# Patient Record
Sex: Female | Born: 1965 | Marital: Married | Smoking: Never smoker
Health system: Southern US, Community
[De-identification: ages and names within clinical notes are randomized; demographics above are authoritative.]

## PROBLEM LIST (undated history)

## (undated) DIAGNOSIS — T7840XA Allergy, unspecified, initial encounter: Secondary | ICD-10-CM

## (undated) DIAGNOSIS — R634 Abnormal weight loss: Secondary | ICD-10-CM

## (undated) DIAGNOSIS — E119 Type 2 diabetes mellitus without complications: Secondary | ICD-10-CM

## (undated) DIAGNOSIS — E079 Disorder of thyroid, unspecified: Secondary | ICD-10-CM

## (undated) HISTORY — DX: Abnormal weight loss: R63.4

## (undated) HISTORY — DX: Type 2 diabetes mellitus without complications: E11.9

## (undated) HISTORY — DX: Allergy, unspecified, initial encounter: T78.40XA

## (undated) HISTORY — DX: Disorder of thyroid, unspecified: E07.9

---

## 2011-10-17 DIAGNOSIS — H269 Unspecified cataract: Secondary | ICD-10-CM | POA: Insufficient documentation

## 2011-10-17 DIAGNOSIS — H40009 Preglaucoma, unspecified, unspecified eye: Secondary | ICD-10-CM | POA: Insufficient documentation

## 2011-10-17 DIAGNOSIS — D1809 Hemangioma of other sites: Secondary | ICD-10-CM | POA: Insufficient documentation

## 2011-10-17 DIAGNOSIS — H3320 Serous retinal detachment, unspecified eye: Secondary | ICD-10-CM | POA: Insufficient documentation

## 2011-10-17 DIAGNOSIS — Z8669 Personal history of other diseases of the nervous system and sense organs: Secondary | ICD-10-CM | POA: Insufficient documentation

## 2012-01-02 DIAGNOSIS — H40003 Preglaucoma, unspecified, bilateral: Secondary | ICD-10-CM | POA: Insufficient documentation

## 2016-03-28 DIAGNOSIS — R7302 Impaired glucose tolerance (oral): Secondary | ICD-10-CM | POA: Insufficient documentation

## 2016-06-05 ENCOUNTER — Ambulatory Visit: Payer: Self-pay | Admitting: Podiatry

## 2016-06-26 ENCOUNTER — Ambulatory Visit (INDEPENDENT_AMBULATORY_CARE_PROVIDER_SITE_OTHER): Payer: Managed Care, Other (non HMO) | Admitting: Podiatry

## 2016-06-26 ENCOUNTER — Ambulatory Visit (HOSPITAL_BASED_OUTPATIENT_CLINIC_OR_DEPARTMENT_OTHER)
Admission: RE | Admit: 2016-06-26 | Discharge: 2016-06-26 | Disposition: A | Discharge status: Home / Self Care | Payer: Managed Care, Other (non HMO) | Source: Ambulatory Visit | Attending: Podiatry | Admitting: Podiatry

## 2016-06-26 ENCOUNTER — Encounter: Payer: Self-pay | Admitting: Podiatry

## 2016-06-26 DIAGNOSIS — M722 Plantar fascial fibromatosis: Secondary | ICD-10-CM | POA: Diagnosis not present

## 2016-06-26 MED ORDER — TRIAMCINOLONE ACETONIDE 10 MG/ML IJ SUSP
10.0000 mg | Freq: Once | INTRAMUSCULAR | Status: AC
  Administered 2016-06-26: 10 mg

## 2016-06-26 MED ORDER — IBUPROFEN 600 MG PO TABS: 600.0000 mg | ORAL_TABLET | Freq: Three times a day (TID) | ORAL | 1 refills | Status: DC | PRN

## 2016-06-26 NOTE — Patient Instructions (Signed)

## 2016-06-26 NOTE — Progress Notes (Signed)
   Subjective:    Patient ID: Kathleen Peterson, female    DOB: 12-15-1965, 51 y.o.   MRN: [id number]  HPI 51 year old female presents the office today for concerns of left heel pain which started in December 2017 with a for Christmas. She states that she been for a walk in a couple days later she started have heel pain. She tried taking meloxicam but this upset her stomach. She did purchase an over-the-counter insert which helps some her pain does continue. She is also been stretching as well as rolling her foot on a frozen water bottle. She denies any recent injury or trauma. No numbness or tingling. No pain at night. No other complaints at this time.   Review of Systems  All other systems reviewed and are negative.      Objective:   Physical Exam General: AAO x3, NAD  Dermatological: Skin is warm, dry and supple bilateral. Nails x 10 are well manicured; remaining integument appears unremarkable at this time. There are no open sores, no preulcerative lesions, no rash or signs of infection present.  Vascular: Dorsalis Pedis artery and Posterior Tibial artery pedal pulses are 2/4 bilateral with immedate capillary fill time. There is no pain with calf compression, swelling, warmth, erythema.   Neruologic: Grossly intact via light touch bilateral. Protective threshold with Semmes Wienstein monofilament intact to all pedal sites bilateral. Negative tinel sign.    Musculoskeletal: Tenderness to palpation along the plantar medial tubercle of the calcaneus at the insertion of plantar fascia on the left foot. There is no pain along the course of the plantar fascia within the arch of the foot. Plantar fascia appears to be intact. There is no pain with lateral compression of the calcaneus or pain with vibratory sensation. There is no pain along the course or insertion of the achilles tendon. No other areas of tenderness to bilateral lower extremities. Muscular strength 5/5 in all groups tested  bilateral.  Gait: Unassisted, Nonantalgic.     Assessment & Plan:  51 year old female left heel pain, likely plantar fasciitis. -Treatment options discussed including all alternatives, risks, and complications. -Etiology of symptoms were discussed -X-rays were obtained and reviewed with the patient.  -Patient elects to proceed with steroid injection into the left heel. Under sterile skin preparation, a total of 2.5cc of kenalog 10, 0.5% Marcaine plain, and 2% lidocaine plain were infiltrated into the symptomatic area without complication. A band-aid was applied. Patient tolerated the injection well without complication. Post-injection care with discussed with the patient. Discussed with the patient to ice the area over the next couple of days to help prevent a steroid flare.  -Plantar fascial brace dispensed -Prescribed ibuprofen. Discussed side effects of the medication and directed to stop if any are to occur and call the office.  -Stretching daily -Ice/heat contrast -Continue with the OTC inserts she has purchased and supportive shoes.   Celesta Gentile, DPM

## 2016-07-17 ENCOUNTER — Encounter: Payer: Self-pay | Admitting: Podiatry

## 2016-07-17 ENCOUNTER — Ambulatory Visit (INDEPENDENT_AMBULATORY_CARE_PROVIDER_SITE_OTHER): Payer: Managed Care, Other (non HMO) | Admitting: Podiatry

## 2016-07-17 DIAGNOSIS — M722 Plantar fascial fibromatosis: Secondary | ICD-10-CM

## 2016-07-18 NOTE — Progress Notes (Signed)
Subjective: 51 year old female presents the office today for follow-up evaluation of left heel pain, plantar fasciitis. She states that she is doing better but not the improvement she would like. She states that the pain in the more is not as excruciating is what was previously. She has been stretching, icing as well the injection did help. She denies any recent injury or trauma. No swelling or redness. The pain does not wake her up at night. Denies any systemic complaints such as fevers, chills, nausea, vomiting. No acute changes since last appointment, and no other complaints at this time.   Objective: AAO x3, NAD DP/PT pulses palpable bilaterally, CRT less than 3 seconds There is improved yet continued tenderness to palpation along the plantar medial tubercle of the calcaneus at the insertion of plantar fascia on the left foot. There is no pain along the course of the plantar fascia within the arch of the foot. Plantar fascia appears to be intact. There is no pain with lateral compression of the calcaneus or pain with vibratory sensation. There is no pain along the course or insertion of the achilles tendon. No other areas of tenderness to bilateral lower extremities. Equinus is present. No open lesions or pre-ulcerative lesions.  No pain with calf compression, swelling, warmth, erythema  Assessment: 52 year old female left heel pain, plantar fasciitis mild improvement  Plan: -All treatment options discussed with the patient including all alternatives, risks, complications.  -Patient elects to proceed with steroid injection into the left heel. Under sterile skin preparation, a total of 2.5cc of kenalog 10, 0.5% Marcaine plain, and 2% lidocaine plain were infiltrated into the symptomatic area without complication. A band-aid was applied. Patient tolerated the injection well without complication. Post-injection care with discussed with the patient. Discussed with the patient to ice the area over the  next couple of days to help prevent a steroid flare.  -Plantar fascial taking was applied. -Dispensed night splint. -Continue stretching, icing exercises daily. -Discussed more supportive shoe gear. Continue the over-the-counter inserts. Symptoms continue discussed custom inserts. -RTC in 3 weeks or sooner if needed.  -Patient encouraged to call the office with any questions, concerns, change in symptoms.   Celesta Gentile, DPM

## 2016-08-07 ENCOUNTER — Encounter: Payer: Self-pay | Admitting: Podiatry

## 2016-08-07 ENCOUNTER — Ambulatory Visit (INDEPENDENT_AMBULATORY_CARE_PROVIDER_SITE_OTHER): Payer: Managed Care, Other (non HMO) | Admitting: Podiatry

## 2016-08-07 DIAGNOSIS — M722 Plantar fascial fibromatosis: Secondary | ICD-10-CM | POA: Diagnosis not present

## 2016-08-08 ENCOUNTER — Telehealth: Payer: Self-pay | Admitting: *Deleted

## 2016-08-08 MED ORDER — METHYLPREDNISOLONE 4 MG PO TBPK: ORAL_TABLET | ORAL | 0 refills | Status: AC

## 2016-08-08 NOTE — Telephone Encounter (Signed)
Pt states she was seen yesterday and Dr. Jacqualyn Posey was to prescribe a steroid, but it was not at the pharmacy. I informed Dr.Wagoner and he ordered. Left message informing pt the steroid pack was at [id number].

## 2016-08-08 NOTE — Progress Notes (Signed)
Subjective: Kathleen Peterson presents to the office today for follow-up evaluation of left heel pain. They state that they are doing somewhat better. She states it is not as bad as what it was when we started but she still gets pain. They have been icing, stretching, try to wear supportive shoe as much as possible. No other complaints at this time. No acute changes since last appointment. They deny any systemic complaints such as fevers, chills, nausea, vomiting.  Objective: General: AAO x3, NAD  Dermatological: Skin is warm, dry and supple bilateral. Nails x 10 are well manicured; remaining integument appears unremarkable at this time. There are no open sores, no preulcerative lesions, no rash or signs of infection present.  Vascular: Dorsalis Pedis artery and Posterior Tibial artery pedal pulses are 2/4 bilateral with immedate capillary fill time. Pedal hair growth present. There is no pain with calf compression, swelling, warmth, erythema.   Neruologic: Grossly intact via light touch bilateral. Vibratory intact via tuning fork bilateral. Protective threshold with Semmes Wienstein monofilament intact to all pedal sites bilateral. Negative tinel sign  Musculoskeletal: There is improved but continues tenderness palpation along the plantar medial tubercle of the calcaneus at the insertion of the plantar fascia on the left foot. There is no pain along the course of the plantar fascia within the arch of the foot. Plantar fascia appears to be intact bilaterally. There is no pain with lateral compression of the calcaneus and there is no pain with vibratory sensation. There is no pain along the course or insertion of the Achilles tendon. There are no other areas of tenderness to bilateral lower extremities. No gross boney pedal deformities bilateral. No pain, crepitus, or limitation noted with foot and ankle range of motion bilateral. Muscular strength 5/5 in all groups tested bilateral.  Gait: Unassisted,  Nonantalgic.   Assessment: Presents for follow-up evaluation for heel pain, likely plantar fasciitis   Plan: -Treatment options discussed including all alternatives, risks, and complications -Patient elects to proceed with steroid injection into the left heel. Under sterile skin preparation, a total of 2.5cc of kenalog 10, 0.5% Marcaine plain, and 2% lidocaine plain were infiltrated into the symptomatic area without complication. A band-aid was applied. Patient tolerated the injection well without complication. Post-injection care with discussed with the patient. Discussed with the patient to ice the area over the next couple of days to help prevent a steroid flare.  -Medrol dose pack. Once complete can restart mobic -Plantar fascial taping applied.  -Ice and stretching exercises on a daily basis. -Continue supportive shoe gear. -Follow-up in 3 weeks or sooner if any problems arise. In the meantime, encouraged to call the office with any questions, concerns, change in symptoms.   Celesta Gentile, DPM

## 2016-09-11 ENCOUNTER — Encounter: Payer: Self-pay | Admitting: Podiatry

## 2016-09-11 ENCOUNTER — Ambulatory Visit (INDEPENDENT_AMBULATORY_CARE_PROVIDER_SITE_OTHER): Payer: Managed Care, Other (non HMO) | Admitting: Podiatry

## 2016-09-11 DIAGNOSIS — M722 Plantar fascial fibromatosis: Secondary | ICD-10-CM

## 2016-09-11 NOTE — Progress Notes (Signed)
Subjective: Kathleen Peterson presents to the office today for follow-up evaluation of left heel pain. She states that overall she is doing better but she still gets pain swelling of her left heel. She was recently in [place] and she had ocular puncture performed for 2 treatments and she felt this actually helped some. She states the pain is intermittent in nature and can come and go. She states some days in the morning or her symptoms will be fine. She does notice that the changes of activity and she's doing frustrated as she just wants to go for a long walk without any pain. She denies any numbness or tingling, grip and it. She has no recent injury. No other complaints no new concerns today.   Objective: General: AAO x3, NAD  Dermatological:  There are no open sores, no preulcerative lesions, no rash or signs of infection present.  Vascular: Dorsalis Pedis artery and Posterior Tibial artery pedal pulses are 2/4 bilateral with immedate capillary fill time. Pedal hair growth present. There is no pain with calf compression, swelling, warmth, erythema.   Neruologic: Grossly intact via light touch bilateral.   Musculoskeletal: There is mild continued tenderness palpation along the plantar medial tubercle of the calcaneus at the insertion of the plantar fascia on the left foot. There is no pain along the course of the plantar fascia within the arch of the foot. Plantar fascia appears to be intact bilaterally. There is no pain with lateral compression of the calcaneus and there is no pain with vibratory sensation. There is no pain along the course or insertion of the Achilles tendon. There are no other areas of tenderness to bilateral lower extremities. No gross boney pedal deformities bilateral. No pain, crepitus, or limitation noted with foot and ankle range of motion bilateral. Muscular strength 5/5 in all groups tested bilateral.  Gait: Unassisted, Nonantalgic.   Assessment: Presents for follow-up  evaluation for heel pain, likely plantar fasciitis   Plan: -Treatment options discussed including all alternatives, risks, and complications -At this point all of her symptoms are improving there still persistent. I've recommended physical therapy prescription was debrided today for [facility]. -She is inquiring about a compression sleeve/arch sleeve for plantar fasciitis. We'll try to get her support and call her once we get this. -Continue stretching, icing exercises at home as well as supportive shoe gear and orthotics. I recommend her to wear tennis shoe types shoe while this is symptomatic. -Follow-up in 4-6 weeks or sooner if any problems arise. In the meantime, encouraged to call the office with any questions, concerns, change in symptoms.   Celesta Gentile, DPM

## 2016-10-30 ENCOUNTER — Ambulatory Visit (INDEPENDENT_AMBULATORY_CARE_PROVIDER_SITE_OTHER): Payer: Managed Care, Other (non HMO) | Admitting: Podiatry

## 2016-10-30 ENCOUNTER — Encounter: Payer: Self-pay | Admitting: Podiatry

## 2016-10-30 ENCOUNTER — Telehealth: Payer: Self-pay | Admitting: *Deleted

## 2016-10-30 DIAGNOSIS — G8929 Other chronic pain: Secondary | ICD-10-CM

## 2016-10-30 DIAGNOSIS — M722 Plantar fascial fibromatosis: Secondary | ICD-10-CM

## 2016-10-30 DIAGNOSIS — M79673 Pain in unspecified foot: Secondary | ICD-10-CM | POA: Diagnosis not present

## 2016-10-30 DIAGNOSIS — M79672 Pain in left foot: Principal | ICD-10-CM

## 2016-10-30 NOTE — Telephone Encounter (Addendum)
-----   Message from Trula Slade, DPM sent at 10/30/2016 11:45 AM EDT ----- Can you please order an MRI of the left foot to rule ou plantar fascial tear? She would like to get this done ASAP. She is OK doing this at the [facility] or [place] or wherever she can get this done ASAP. Thanks. Orders given to [facility] for pre-cert and faxed to [email or web address]

## 2016-10-30 NOTE — Progress Notes (Signed)
Subjective: Kathleen Peterson presents to the office today for follow-up evaluation of left heel pain. She presents to the upset and frustrated she still having pain to her heel. She's been continued physical therapy she states that she is actually doing well with her therapist. However her physical therapist is out of town any therapist was taking over and she did her exercises barefoot and since then she has had ongoing pain to the area. She states that the pain is consistent with a day and she is scared even walk at times because she may fall because of the pain. She denies any numbness or tingling in the pain does not wake her up at night. She has continue stretching, icing, try to wear supportive shoes any significant improvement. No other complaints no new concerns today.   Objective: General: AAO x3, NAD  Dermatological:  There are no open sores, no preulcerative lesions, no rash or signs of infection present.  Vascular: Dorsalis Pedis artery and Posterior Tibial artery pedal pulses are 2/4 bilateral with immedate capillary fill time. Pedal hair growth present. There is no pain with calf compression, swelling, warmth, erythema.   Neruologic: Grossly intact via light touch bilateral.   Musculoskeletal: There is continued tenderness palpation along the plantar medial tubercle of the calcaneus at the insertion of the plantar fascia on the left foot. There is no pain along the course of the plantar fascia within the arch of the foot. Plantar fascia appears to be intact bilaterally. There is no pain with lateral compression of the calcaneus and there is no pain with vibratory sensation. There is no pain along the course or insertion of the Achilles tendon. There are no other areas of tenderness to bilateral lower extremities. No gross boney pedal deformities bilateral. No pain, crepitus, or limitation noted with foot and ankle range of motion bilateral. Muscular strength 5/5 in all groups tested bilateral.  Equinus is present. Overall, the exam is unchanged.   Gait: Unassisted, Nonantalgic.   Assessment: Presents for follow-up evaluation for heel pain, likely plantar fasciitis   Plan: -Treatment options discussed including all alternatives, risks, and complications -At this point given her ongoing symptoms I recommended an MRI. She is attended numerous conservative treatments without any significant improvement. This is also for possible surgical planning. -Given her longevity symptoms will immobilize and a CAM boot which was dispensed today. -I ordered an arthritis panel today. -Long-term discussed there custom orthotics, possible surgery, possible EPAT. However, will await the MRI before proceeding. She agrees to this plan and wants an MRI. Will try to get this done ASAP.   Celesta Gentile, DPM

## 2016-11-04 ENCOUNTER — Ambulatory Visit
Admission: RE | Admit: 2016-11-04 | Discharge: 2016-11-04 | Disposition: A | Discharge status: Home / Self Care | Payer: Managed Care, Other (non HMO) | Source: Ambulatory Visit | Attending: Podiatry | Admitting: Podiatry

## 2016-11-05 ENCOUNTER — Telehealth: Payer: Self-pay | Admitting: Podiatry

## 2016-11-05 NOTE — Telephone Encounter (Signed)
I informed pt I would tell Dr. Jacqualyn Posey the results for both labs and MRI were available. I told pt she could stop by the [facility] and L. Cox would exchange the boot. Pt states understanding.

## 2016-11-05 NOTE — Telephone Encounter (Signed)
I'm calling about blood work I had done last week and the MRI I had done yesterday. I was calling to see if you have any results in yet. Also, the brace Dr. Jacqualyn Posey gave me, the medal piece is coming off and its not holding together well. If you would, please call me back.

## 2016-11-05 NOTE — Telephone Encounter (Signed)
Called patient and will start EPAT.

## 2016-11-06 LAB — HLA-B27 ANTIGEN: HLA B27: NEGATIVE

## 2016-11-06 LAB — C-REACTIVE PROTEIN: CRP: 3 mg/L (ref 0.0–4.9)

## 2016-11-06 LAB — RHEUMATOID FACTOR

## 2016-11-06 LAB — SEDIMENTATION RATE: Sed Rate: 2 mm/hr (ref 0–40)

## 2016-11-06 LAB — ANA: ANA: NEGATIVE

## 2016-11-08 ENCOUNTER — Ambulatory Visit (INDEPENDENT_AMBULATORY_CARE_PROVIDER_SITE_OTHER): Payer: Managed Care, Other (non HMO) | Admitting: Podiatry

## 2016-11-08 DIAGNOSIS — M722 Plantar fascial fibromatosis: Secondary | ICD-10-CM

## 2016-11-09 NOTE — Progress Notes (Signed)
Pt presents with ongoing pain in her left heel. She has tried various treatments with no long term success.   Pain on palpation of Lt heel medial plantar fascial band, with swelling  ESWT 1.8 joules delivered to Lt heel and tolerated well. EPAT delivered to surrounding connective tissues. Advised on boot usage and avoidance of NSAIDs and ice. Follow up in 1 week for 2nd treatment

## 2016-11-14 ENCOUNTER — Encounter: Payer: Self-pay | Admitting: Podiatry

## 2016-11-14 ENCOUNTER — Ambulatory Visit (INDEPENDENT_AMBULATORY_CARE_PROVIDER_SITE_OTHER): Payer: Self-pay

## 2016-11-14 DIAGNOSIS — M722 Plantar fascial fibromatosis: Secondary | ICD-10-CM

## 2016-11-14 NOTE — Progress Notes (Signed)
MRI did not show the insertion and given continued pain discussed with Kathleen Peterson about getting a revision of the MRI. We had ordered this as the foot to look for the plantar fascia but did not include the heel. Kathleen Peterson is going to contact [facility] to try to have them do a revision of the MRI to look at the entire fascia.   Kathleen Peterson has had numerous conservative treatments without complete resolution of pain. She was actually doing better at physical therapy but she states that her normal therapist was out of town and she had a replacement. The new therapist did the exercises barefoot. Once she did this it seems that she took a big step back. She did relate that she had some improvement in the CAM boot but has not resolved the pain completely. For now will continue with EPAT for another treatment. After this would get a repeat MRI.    Kathleen Peterson, DPM

## 2016-11-14 NOTE — Progress Notes (Signed)
Pt presents with ongoing pain in her left heel. She has tried various treatments with no long term success. She said she has noticed a small improvement but her pain is still present  Pain on palpation of Lt heel medial plantar fascial band, with swelling  ESWT 2.2 joules delivered to Lt heel and tolerated well. EPAT delivered to surrounding connective tissues. Advised on boot usage and avoidance of NSAIDs and ice. Follow up in 1 week for 3rd treatment

## 2016-11-22 ENCOUNTER — Ambulatory Visit (INDEPENDENT_AMBULATORY_CARE_PROVIDER_SITE_OTHER): Payer: Self-pay | Admitting: Podiatry

## 2016-11-22 DIAGNOSIS — M722 Plantar fascial fibromatosis: Secondary | ICD-10-CM

## 2016-11-22 NOTE — Progress Notes (Signed)
Pt presents with ongoing pain in her left heel. She has tried various treatments with no long term success. She said she has noticed an improvement her pain is still present but improved  Pain on palpation of Lt heel medial plantar fascial band, with swelling  ESWT 2.2 joules delivered to Lt heel and tolerated well. EPAT delivered to surrounding connective tissues. Dispensed powersteps (no charge), and advised on caution when increasing activities. We will consider ordering a revised MRI after completion of ESWT therapy. Advised on boot usage and avoidance of NSAIDs and ice. Follow up in 4 weeks for 4th treatment

## 2016-12-21 ENCOUNTER — Ambulatory Visit: Payer: Managed Care, Other (non HMO)

## 2017-01-01 ENCOUNTER — Ambulatory Visit (INDEPENDENT_AMBULATORY_CARE_PROVIDER_SITE_OTHER): Payer: Managed Care, Other (non HMO) | Admitting: Podiatry

## 2017-01-01 DIAGNOSIS — M722 Plantar fascial fibromatosis: Secondary | ICD-10-CM

## 2017-01-02 DIAGNOSIS — Z86018 Personal history of other benign neoplasm: Secondary | ICD-10-CM | POA: Insufficient documentation

## 2017-01-02 DIAGNOSIS — Z9889 Other specified postprocedural states: Secondary | ICD-10-CM | POA: Insufficient documentation

## 2017-01-02 DIAGNOSIS — E896 Postprocedural adrenocortical (-medullary) hypofunction: Secondary | ICD-10-CM | POA: Insufficient documentation

## 2017-01-02 DIAGNOSIS — E039 Hypothyroidism, unspecified: Secondary | ICD-10-CM | POA: Insufficient documentation

## 2017-01-02 NOTE — Progress Notes (Signed)
Pt presents with ongoing pain in her left heel. She has tried various treatments with no long term success. She said she has noticed an improvement her pain is still present but improved a lot. Recently she has been on 2 trips that require a lot of walking and her pain has been manageable  Pain on palpation of Lt heel medial plantar fascial band, with swelling  ESWT 1.5 joules delivered to Lt heel and tolerated well. EPAT delivered to surrounding connective tissues. Re-appointed in 4 weeks to see Dr Jacqualyn Posey

## 2017-02-12 ENCOUNTER — Ambulatory Visit (INDEPENDENT_AMBULATORY_CARE_PROVIDER_SITE_OTHER): Payer: Managed Care, Other (non HMO) | Admitting: Podiatry

## 2017-02-12 ENCOUNTER — Encounter: Payer: Self-pay | Admitting: Podiatry

## 2017-02-12 DIAGNOSIS — M722 Plantar fascial fibromatosis: Secondary | ICD-10-CM

## 2017-02-13 NOTE — Progress Notes (Signed)
Subjective: Kathleen Peterson presents the office today for follow-up evaluation of left heel pain. She states that she is doing much better and she has been walking the dog the last couple weeks which is been very good for her she states. She is not able to this and for some time. She states that the EPAT has helped quite a bit. She still gets pain on daily basis but to a much lower degree than what she was having. She had 4 treatments of the EPAT. She has been continuing stretching but she has not been icing. She wears the orthotics all the time. She has no other concerns today.  Denies any systemic complaints such as fevers, chills, nausea, vomiting. No acute changes since last appointment, and no other complaints at this time.   Objective: AAO x3, NAD DP/PT pulses palpable bilaterally, CRT less than 3 seconds There is mild but continued tenderness to palpation along the plantar medial tubercle of the calcaneus at the insertion of plantar fascia on the let foot. Overall with tenderness is much improved There is no pain along the course of the plantar fascia within the arch of the foot. Plantar fascia appears to be intact. There is no pain with lateral compression of the calcaneus or pain with vibratory sensation. There is no pain along the course or insertion of the achilles tendon. No other areas of tenderness to bilateral lower extremities. No open lesions or pre-ulcerative lesions.  No pain with calf compression, swelling, warmth, erythema  Assessment: Left heel pain and plantar fasciitis which is improving   Plan: -All treatment options discussed with the patient including all alternatives, risks, complications.  -At this time a discussed further treatment options both conservative and surgical. We will do one more treatment of the EPAT and we will get the [place] offices schedule her for this. Continue with stretching and she denies in the meantime until her last treatment. She agrees this plan. We'll  hold off on surgical intervention this time. -Patient encouraged to call the office with any questions, concerns, change in symptoms.   Celesta Gentile, DPM

## 2017-02-22 ENCOUNTER — Ambulatory Visit: Payer: Managed Care, Other (non HMO) | Admitting: Podiatry

## 2017-02-22 DIAGNOSIS — M722 Plantar fascial fibromatosis: Secondary | ICD-10-CM

## 2017-02-25 NOTE — Progress Notes (Signed)
Pt presents with ongoing pain in her left heel. She has tried various treatments with no long term success.She states that she really has not noticed an improvement in her pain since starting treatments  Pain on palpation of Lt heel medial plantar fascial band, with swelling  ESWT 1.5 joules delivered to Lt heel and tolerated well. EPAT delivered to surrounding connective tissues. Re-appointed in 2-3 weeks to see Dr Jacqualyn Posey if there has been no improvement in her pain. Patient states that if she needs surgery then she wants to get it done before the end of the year

## 2017-03-12 ENCOUNTER — Ambulatory Visit: Payer: Managed Care, Other (non HMO) | Admitting: Podiatry

## 2017-03-12 ENCOUNTER — Encounter: Payer: Self-pay | Admitting: Podiatry

## 2017-03-12 DIAGNOSIS — M722 Plantar fascial fibromatosis: Secondary | ICD-10-CM | POA: Diagnosis not present

## 2017-03-12 NOTE — Patient Instructions (Signed)
[facility] [place], [place] [phone]  [address]   The procedure we talked about was called "Topaz"

## 2017-03-14 ENCOUNTER — Telehealth: Payer: Self-pay | Admitting: Podiatry

## 2017-03-14 NOTE — Telephone Encounter (Signed)
Routed message to Marcine Matar, she has been discussing the situation with [facility].

## 2017-03-14 NOTE — Progress Notes (Signed)
Subjective: Kathleen Peterson presents the office today for follow-up evaluation of left heel pain.  She does state that overall her pain has improved compared to when I first met her but she still gets pain on a daily basis she does have good days and bad days.  She states that she will have a good day and all of a sudden she will get a sharp pain or shoulder throbbing pain.  She still gets pain in the mornings when she first gets up or if she is been sitting down for some time and stands back up.  She denies any recent injury or trauma denies any increase in swelling or redness.  The pain does not wake her up at night. The EPAT was helping at first but no longer has been helpful.  Denies any systemic complaints such as fevers, chills, nausea, vomiting. No acute changes since last appointment, and no other complaints at this time.   Objective: AAO x3, NAD DP/PT pulses palpable bilaterally, CRT less than 3 seconds There is mild but continued tenderness to palpation along the plantar medial tubercle of the calcaneus at the insertion of plantar fascia on the left foot. There is no pain along the course of the plantar fascia within the arch of the foot. Plantar fascia appears to be intact. There is no pain with lateral compression of the calcaneus or pain with vibratory sensation. There is no pain along the course or insertion of the achilles tendon. No other areas of tenderness to bilateral lower extremities.  NO other areas of pinpoint tenderness.  Negative tinel sign No open lesions or pre-ulcerative lesions.  No pain with calf compression, swelling, warmth, erythema  Assessment: Chronic left heel pain; plantar fasciitis  Plan: -All treatment options discussed with the patient including all alternatives, risks, complications.  -At this point she is attempted numerous conservative treatments without any significant complete resolution of pain.  Had a discussion today in regards to further treatment options.  I  able to get the MRI completed to evaluate the entire length of the plantar fascia as and she had the MRI done only a portion of it was completed.  We will contact [facility] in regards to getting this done. -We discussed other treatment options at this point including conservative as well as surgical options.  She states that she does not want to proceed with an EPF if able to avoid it.  Discussed other treatment options.  We discussed medical massage and reflexology. We also discussed other surgical options including Topaz. We discussed amniotic injection, PRP as well.  She will start with trying more conservative treatment before surgery and we will await the new MRI.  -Patient encouraged to call the office with any questions, concerns, change in symptoms.   Trula Slade DPM

## 2017-03-14 NOTE — Telephone Encounter (Signed)
I saw Dr. Jacqualyn Posey on Tuesday and he was going to have my MRI repeated. I have not heard anything from anyone so I was following up on this. My number is [phone].

## 2017-03-19 ENCOUNTER — Telehealth: Payer: Self-pay

## 2017-03-19 NOTE — Telephone Encounter (Signed)
Spoke with patient informed her that I had been in touch with [facility] and that we were still awaiting a response from Dr Posey Pronto about whether to have patient come in for an addendum to the original order or if the patient will need an entire separate MRI for the ankle.

## 2017-03-21 ENCOUNTER — Other Ambulatory Visit: Payer: Self-pay | Admitting: Podiatry

## 2017-03-21 DIAGNOSIS — M79672 Pain in left foot: Secondary | ICD-10-CM

## 2017-03-22 ENCOUNTER — Telehealth: Payer: Self-pay

## 2017-03-22 ENCOUNTER — Ambulatory Visit
Admission: RE | Admit: 2017-03-22 | Discharge: 2017-03-22 | Disposition: A | Discharge status: Home / Self Care | Payer: Managed Care, Other (non HMO) | Source: Ambulatory Visit | Attending: Podiatry | Admitting: Podiatry

## 2017-03-22 DIAGNOSIS — M79672 Pain in left foot: Secondary | ICD-10-CM

## 2017-03-22 NOTE — Telephone Encounter (Signed)
Spoke with patient explaining to her about her MRI results. Informed her to continue with massage therapy and once its complete if she was still in pain then she needed to make an appt to be seen

## 2017-05-06 ENCOUNTER — Telehealth: Payer: Self-pay

## 2017-05-06 NOTE — Telephone Encounter (Signed)
Spoke with patient inquiring about her progression with Neuromuscular therapy. She states that she continues to work with the therapist and has had 2 days without pain in her feet. I advised her should anything change she is to make an appt to see Dr Jacqualyn Posey for evaluation

## 2017-08-09 ENCOUNTER — Ambulatory Visit: Payer: Managed Care, Other (non HMO) | Admitting: Podiatry

## 2017-08-09 ENCOUNTER — Ambulatory Visit (INDEPENDENT_AMBULATORY_CARE_PROVIDER_SITE_OTHER): Payer: Managed Care, Other (non HMO)

## 2017-08-09 ENCOUNTER — Encounter: Payer: Self-pay | Admitting: Podiatry

## 2017-08-09 DIAGNOSIS — M779 Enthesopathy, unspecified: Secondary | ICD-10-CM | POA: Diagnosis not present

## 2017-08-09 DIAGNOSIS — M79674 Pain in right toe(s): Secondary | ICD-10-CM

## 2017-08-09 DIAGNOSIS — M2041 Other hammer toe(s) (acquired), right foot: Secondary | ICD-10-CM

## 2017-08-12 ENCOUNTER — Telehealth: Payer: Self-pay | Admitting: *Deleted

## 2017-08-12 ENCOUNTER — Ambulatory Visit (INDEPENDENT_AMBULATORY_CARE_PROVIDER_SITE_OTHER): Payer: Managed Care, Other (non HMO) | Admitting: Orthotics

## 2017-08-12 DIAGNOSIS — M79672 Pain in left foot: Secondary | ICD-10-CM

## 2017-08-12 DIAGNOSIS — M722 Plantar fascial fibromatosis: Secondary | ICD-10-CM

## 2017-08-12 DIAGNOSIS — G8929 Other chronic pain: Secondary | ICD-10-CM

## 2017-08-12 DIAGNOSIS — M779 Enthesopathy, unspecified: Secondary | ICD-10-CM

## 2017-08-12 DIAGNOSIS — M2041 Other hammer toe(s) (acquired), right foot: Secondary | ICD-10-CM

## 2017-08-12 MED ORDER — MELOXICAM 15 MG PO TABS: 15.0000 mg | ORAL_TABLET | Freq: Every day | ORAL | 0 refills | Status: DC

## 2017-08-12 NOTE — Addendum Note (Signed)
Addended by: Celesta Gentile R on: 08/12/2017 09:08 AM   Modules accepted: Orders

## 2017-08-12 NOTE — Progress Notes (Signed)
Patient came into today to be cast for Custom Foot Orthotics. Upon recommendation of Dr. Jacqualyn Posey  Patient presents with hx of PF (L), and now has 2nd cap (L) Goals are offloading 2nd met with sulcus length dress orthotics Plan vendor [employer]

## 2017-08-12 NOTE — Telephone Encounter (Signed)
I informed pt the meloxicam had been called to the [facility].

## 2017-08-12 NOTE — Progress Notes (Signed)
Subjective: 52 year old female presents the office with her new concerns of pain to her right foot and she points on the second toe.  She states that the toe feels like it is jammed and she has noticed some swelling to the toe joint, pointing to the MPJ.  This is been ongoing for the last 2 weeks.  Denies any recent injury or trauma she said no recent treatment.  States that the left foot is doing much better.  She has some occasional discomfort but overall is much improved.  She is wearing insert only on her left foot. Denies any systemic complaints such as fevers, chills, nausea, vomiting. No acute changes since last appointment, and no other complaints at this time.   Objective: AAO x3, NAD DP/PT pulses palpable bilaterally, CRT less than 3 seconds On the right foot there is tenderness of the right second MTPJ there is localized edema to this area.  Also hammertoe contractures present.  There is no specific area pinpoint bony tenderness or pain to vibratory sensation.  There is no other areas of tenderness identified to the right side.  Very minimal discomfort to the left heel on the plantar medial tubercle of the calcaneus and insertion of plantar fascia. No open lesions or pre-ulcerative lesions.  No pain with calf compression, swelling, warmth, erythema  Assessment: Capsulitis right foot, hammertoe deformity  Plan: -All treatment options discussed with the patient including all alternatives, risks, complications.  -X-rays were obtained and reviewed.  No definitive evidence of acute fracture or stress fracture.  Elongated second metatarsal is also present. -Discussed a steroid injection and she wished to proceed.  See procedure note below. -Offloading pads were dispensed to help offload submetatarsal 2.  I discussed that she should not wear just one orthotic.  I would try to wear either mail orthotics or an insert in both shoes but this is off balance. -Discussed wearing a stiffer soled  shoe. -Patient encouraged to call the office with any questions, concerns, change in symptoms.   Procedure: Injection MPJ Discussed alternatives, risks, complications and verbal consent was obtained.  Location: Right second MTPJ-injection was directed into and around the MPJ Skin Prep: Betadine. Injectate: 0.5cc 0.5% marcaine plain, 0.5 cc 2% lidocaine plain and, 0.5 cc kenalog 10. Disposition: Patient tolerated procedure well. Injection site dressed with a band-aid.  Post-injection care was discussed and return precautions discussed.    Trula Slade DPM

## 2017-09-08 ENCOUNTER — Other Ambulatory Visit: Payer: Self-pay | Admitting: Podiatry

## 2017-09-12 ENCOUNTER — Ambulatory Visit: Payer: Managed Care, Other (non HMO) | Admitting: Orthotics

## 2017-09-12 DIAGNOSIS — M779 Enthesopathy, unspecified: Secondary | ICD-10-CM

## 2017-09-12 DIAGNOSIS — M722 Plantar fascial fibromatosis: Secondary | ICD-10-CM

## 2017-09-12 DIAGNOSIS — M2041 Other hammer toe(s) (acquired), right foot: Secondary | ICD-10-CM

## 2017-09-12 NOTE — Progress Notes (Signed)
Patient came in today to pick up custom made foot orthotics.  The goals were accomplished and the patient reported no dissatisfaction with said orthotics.  Patient was advised of breakin period and how to report any issues. 

## 2017-10-08 ENCOUNTER — Other Ambulatory Visit: Payer: Self-pay | Admitting: Podiatry

## 2018-01-16 IMAGING — MR MR FOOT*L* W/O CM
3 of 5 series · 8 of 40 positions shown · non-contrast
Comparison: MRI left forefoot dated November 04, 2016.

CLINICAL DATA: Left foot pain.  Concern for plantar fasciitis.

EXAM:
MRI OF THE LEFT FOOT WITHOUT CONTRAST
TECHNIQUE: Multiplanar, multisequence MR imaging of the left hindfoot was
performed. No intravenous contrast was administered.

[Series 3: PD fat-sat · axial · left · 4.0mm · 0.20mm/px · z∈[-32,+46]mm · 3 of 27 slices shown]
[im 5/27]
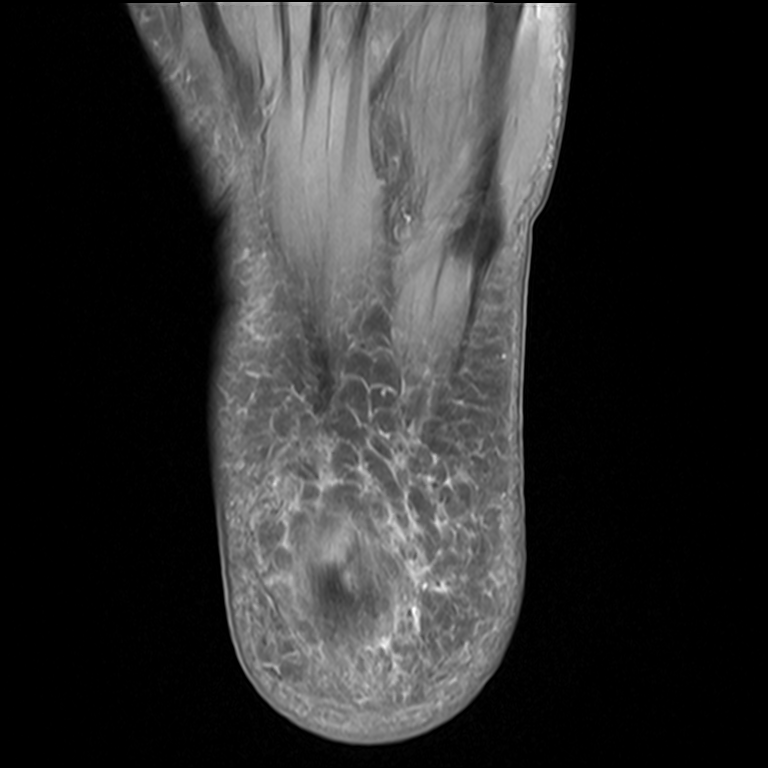
[im 14/27]
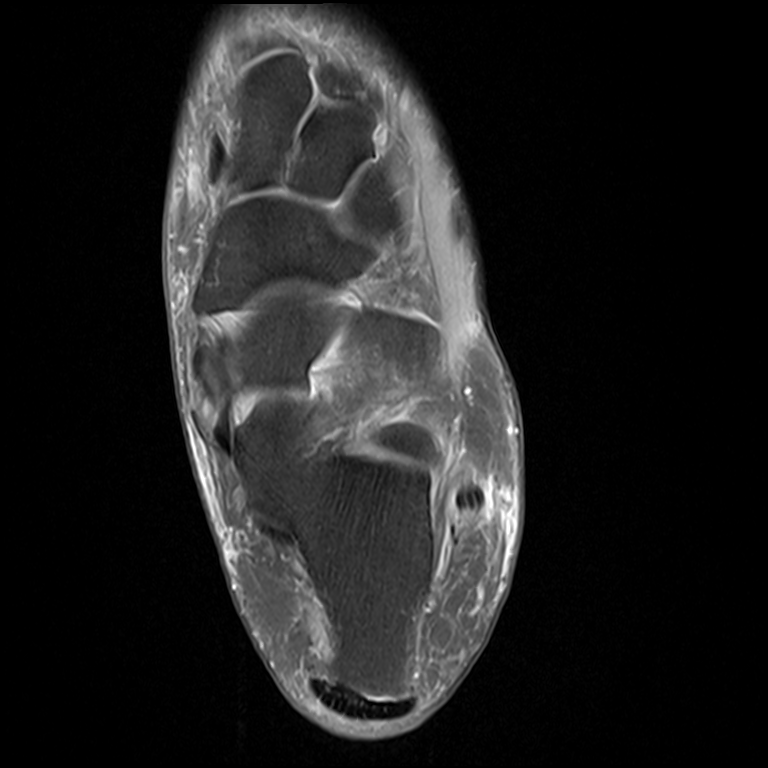
[im 22/27]
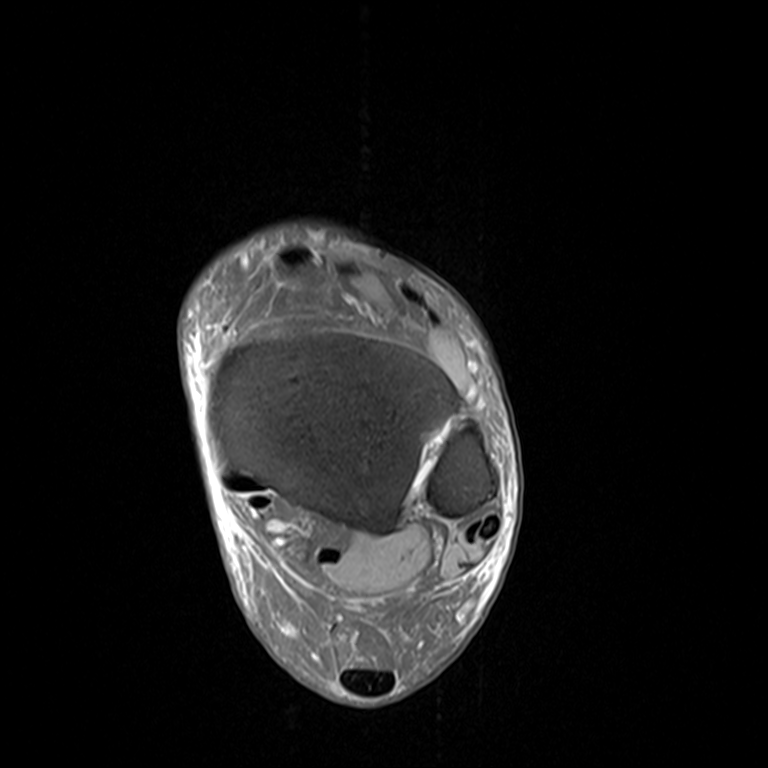

[Series 4: T2 fat-sat · axial · left · 4.0mm · 0.20mm/px · z∈[-32,+46]mm · 3 of 27 slices shown (1 of 2)]
[im 5/27]
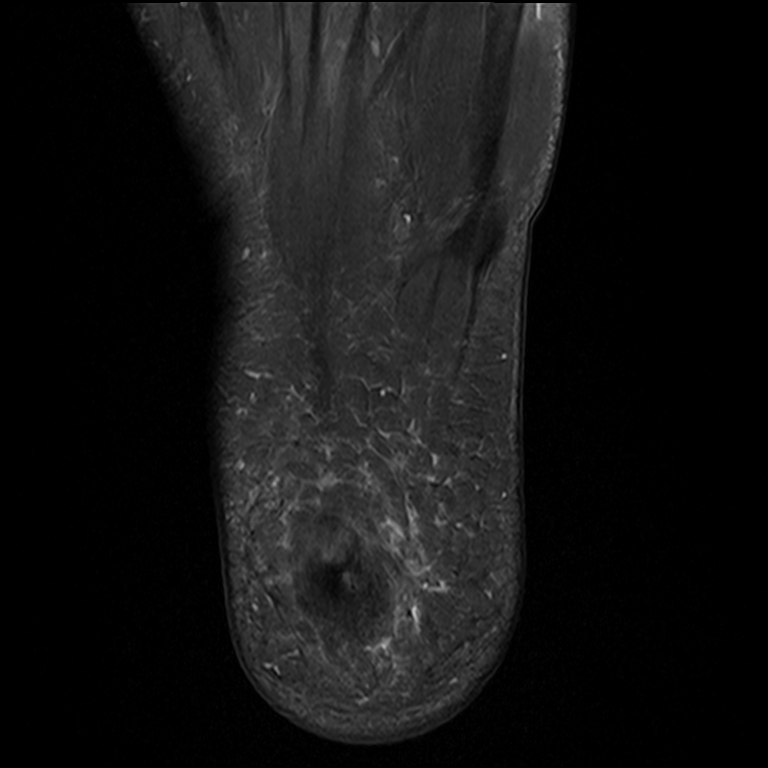
[im 14/27]
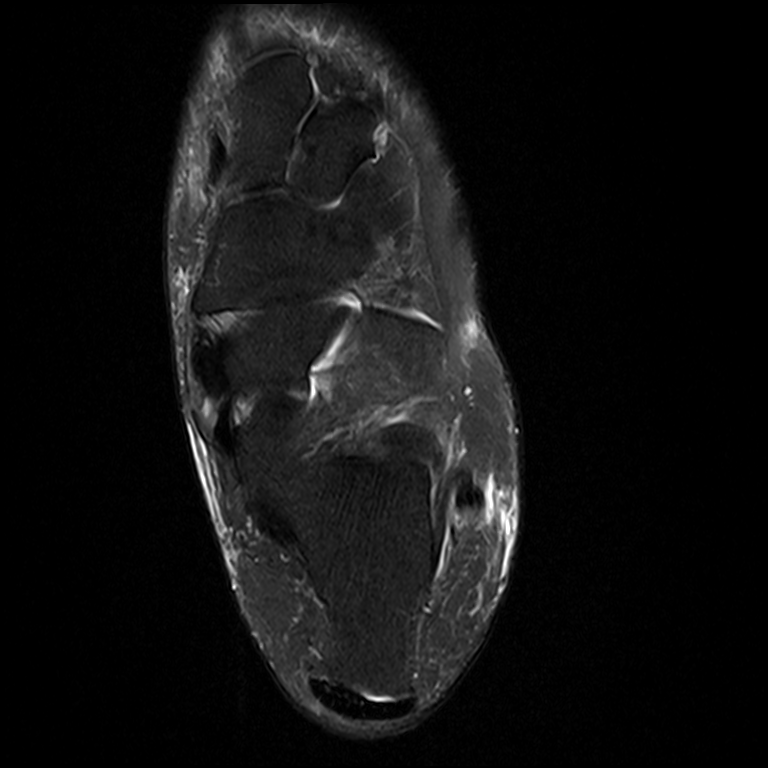
[im 22/27]
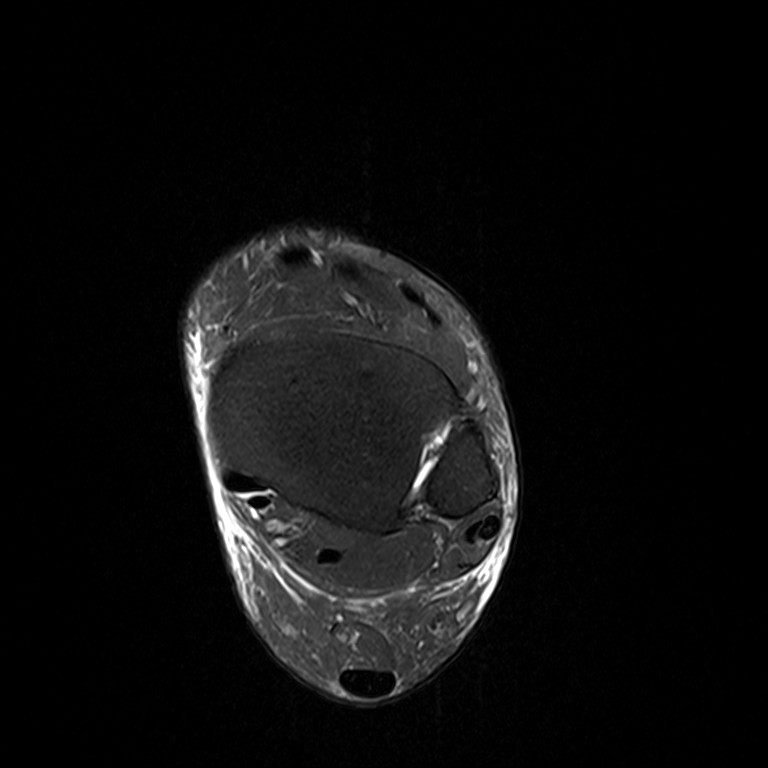

[Series 5: T2 fat-sat · sagittal · left · 2.5mm · 0.21mm/px · 2 of 30 slices shown (2 of 2)]
[im 5/30]
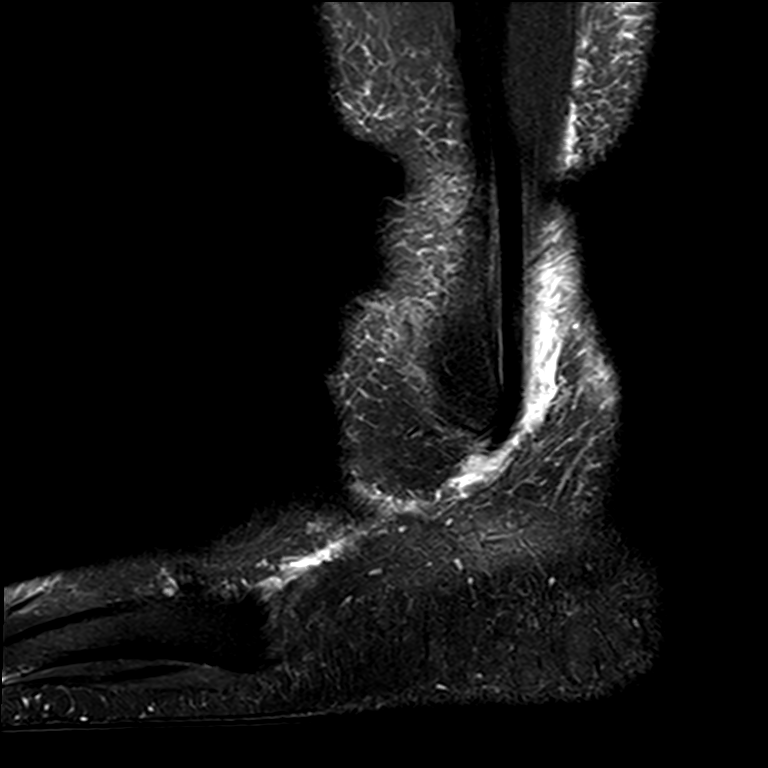
[im 17/30]
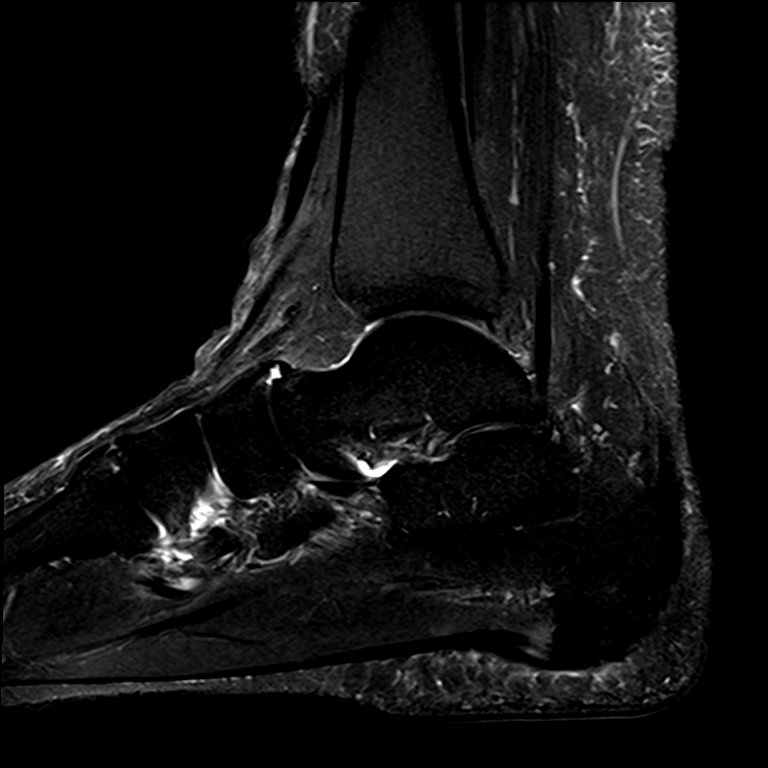

[8 of 40 positions shown; findings below may reference images not displayed]

FINDINGS: TENDONS

Peroneal: Intact and normally positioned. Minimal abnormal
intermediate signal and thickening of the peroneus longus tendon
behind the lateral malleolus. Mild increased intermediate signal
within the distal peroneus brevis tendon with a small amount of
surrounding fluid in the tendon sheath.

Posteromedial: Intact and normally positioned. Small amount of fluid
in the posterior tibialis tendon sheath.

Anterior: Intact and normally positioned.

Achilles: Intact.  Trace fluid in the retrocalcaneal bursa.

Plantar Fascia: Thickening and increased intermediate signal within
the central band of the proximal plantar fascia. There may be small
areas of interstitial tearing. Mild edema in the calcaneus at the
plantar fascia origin.

LIGAMENTS

Lateral: The anterior and posterior talofibular and calcaneofibular
ligaments are intact. The anterior and posterior tibiofibular
ligaments are intact.

Medial: The deltoid and visualized portions of the spring ligament
appear intact.

CARTILAGE AND BONES

Ankle Joint: No significant ankle joint effusion. The talar dome and
tibial plafond are intact.

Subtalar Joints/Sinus Tarsi: Unremarkable.

Bones: No significant extra-articular osseous findings.

Other: Mild bimalleolar soft tissue swelling. No drainable fluid
collection.
IMPRESSION: 1. Findings consistent with plantar fasciitis. There may be small
areas of interstitial tearing within the central band near the
origin. No high-grade tear.
2. Mild distal peroneus brevis tendinosis and tenosynovitis. Minimal
peroneus longus tendinosis.
3. Mild posterior tibialis tenosynovitis.
